# Patient Record
Sex: Male | Born: 1990 | Race: White | Hispanic: No | Marital: Single | Smoking: Current every day smoker
Health system: Southern US, Community
[De-identification: ages and names within clinical notes are randomized; demographics above are authoritative.]

---
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
## 2016-06-01 ENCOUNTER — Encounter (HOSPITAL_BASED_OUTPATIENT_CLINIC_OR_DEPARTMENT_OTHER): Payer: Self-pay | Admitting: Emergency Medicine

## 2016-06-01 ENCOUNTER — Emergency Department (HOSPITAL_BASED_OUTPATIENT_CLINIC_OR_DEPARTMENT_OTHER): Payer: 59

## 2016-06-01 ENCOUNTER — Encounter (HOSPITAL_COMMUNITY): Admission: EM | Disposition: A | Discharge status: Home / Self Care | Payer: Self-pay | Source: Home / Self Care

## 2016-06-01 ENCOUNTER — Inpatient Hospital Stay (HOSPITAL_BASED_OUTPATIENT_CLINIC_OR_DEPARTMENT_OTHER)
Admission: EM | Admit: 2016-06-01 | Discharge: 2016-06-03 | DRG: 340 | Disposition: A | Discharge status: Home / Self Care | Payer: 59 | Attending: Surgery | Admitting: Surgery

## 2016-06-01 ENCOUNTER — Emergency Department (HOSPITAL_COMMUNITY): Payer: 59 | Admitting: Certified Registered Nurse Anesthetist

## 2016-06-01 DIAGNOSIS — R1031 Right lower quadrant pain: Secondary | ICD-10-CM | POA: Diagnosis not present

## 2016-06-01 DIAGNOSIS — K353 Acute appendicitis with localized peritonitis: Principal | ICD-10-CM | POA: Diagnosis present

## 2016-06-01 DIAGNOSIS — K5900 Constipation, unspecified: Secondary | ICD-10-CM | POA: Diagnosis present

## 2016-06-01 DIAGNOSIS — K3589 Other acute appendicitis without perforation or gangrene: Secondary | ICD-10-CM

## 2016-06-01 DIAGNOSIS — K3580 Unspecified acute appendicitis: Secondary | ICD-10-CM | POA: Diagnosis present

## 2016-06-01 DIAGNOSIS — F17210 Nicotine dependence, cigarettes, uncomplicated: Secondary | ICD-10-CM | POA: Diagnosis present

## 2016-06-01 HISTORY — PX: LAPAROSCOPIC APPENDECTOMY: SHX408

## 2016-06-01 LAB — URINALYSIS, ROUTINE W REFLEX MICROSCOPIC
GLUCOSE, UA: NEGATIVE mg/dL
HGB URINE DIPSTICK: NEGATIVE
Ketones, ur: 15 mg/dL — AB
Leukocytes, UA: NEGATIVE
Nitrite: NEGATIVE
PH: 6 (ref 5.0–8.0)
Protein, ur: NEGATIVE mg/dL
SPECIFIC GRAVITY, URINE: 1.026 (ref 1.005–1.030)

## 2016-06-01 LAB — COMPREHENSIVE METABOLIC PANEL
ALK PHOS: 66 U/L (ref 38–126)
ALT: 25 U/L (ref 17–63)
ANION GAP: 8 (ref 5–15)
AST: 24 U/L (ref 15–41)
Albumin: 4.2 g/dL (ref 3.5–5.0)
BILIRUBIN TOTAL: 1.5 mg/dL — AB (ref 0.3–1.2)
BUN: 12 mg/dL (ref 6–20)
CALCIUM: 9 mg/dL (ref 8.9–10.3)
CO2: 28 mmol/L (ref 22–32)
CREATININE: 0.94 mg/dL (ref 0.61–1.24)
Chloride: 99 mmol/L — ABNORMAL LOW (ref 101–111)
GFR calc non Af Amer: >60 mL/min (ref >60)
Glucose, Bld: 92 mg/dL (ref 65–99)
Potassium: 3.5 mmol/L (ref 3.5–5.1)
Sodium: 135 mmol/L (ref 135–145)
TOTAL PROTEIN: 7.5 g/dL (ref 6.5–8.1)

## 2016-06-01 LAB — CBC
HCT: 42.3 % (ref 39.0–52.0)
HEMOGLOBIN: 14.8 g/dL (ref 13.0–17.0)
MCH: 30.7 pg (ref 26.0–34.0)
MCHC: 35 g/dL (ref 30.0–36.0)
MCV: 87.8 fL (ref 78.0–100.0)
Platelets: 176 10*3/uL (ref 150–400)
RBC: 4.82 MIL/uL (ref 4.22–5.81)
RDW: 12.5 % (ref 11.5–15.5)
WBC: 14.1 10*3/uL — AB (ref 4.0–10.5)

## 2016-06-01 LAB — CREATININE, SERUM: CREATININE: 1.03 mg/dL (ref 0.61–1.24)

## 2016-06-01 LAB — CBC WITH DIFFERENTIAL/PLATELET
BASOS PCT: 0 %
Basophils Absolute: 0 10*3/uL (ref 0.0–0.1)
EOS ABS: 0 10*3/uL (ref 0.0–0.7)
Eosinophils Relative: 0 %
HEMATOCRIT: 43 % (ref 39.0–52.0)
HEMOGLOBIN: 15.3 g/dL (ref 13.0–17.0)
LYMPHS ABS: 1.8 10*3/uL (ref 0.7–4.0)
Lymphocytes Relative: 11 %
MCH: 31.2 pg (ref 26.0–34.0)
MCHC: 35.6 g/dL (ref 30.0–36.0)
MCV: 87.6 fL (ref 78.0–100.0)
MONO ABS: 1.8 10*3/uL — AB (ref 0.1–1.0)
MONOS PCT: 11 %
NEUTROS ABS: 13.2 10*3/uL — AB (ref 1.7–7.7)
NEUTROS PCT: 78 %
Platelets: 180 10*3/uL (ref 150–400)
RBC: 4.91 MIL/uL (ref 4.22–5.81)
RDW: 12.6 % (ref 11.5–15.5)
WBC: 16.9 10*3/uL — ABNORMAL HIGH (ref 4.0–10.5)

## 2016-06-01 LAB — LIPASE, BLOOD: Lipase: 24 U/L (ref 11–51)

## 2016-06-01 SURGERY — APPENDECTOMY, LAPAROSCOPIC
Anesthesia: General | Site: Abdomen

## 2016-06-01 MED ORDER — MORPHINE SULFATE (PF) 4 MG/ML IV SOLN
4.0000 mg | Freq: Once | INTRAVENOUS | Status: AC
  Administered 2016-06-01: 4 mg via INTRAVENOUS
  Filled 2016-06-01: qty 1

## 2016-06-01 MED ORDER — PROMETHAZINE HCL 25 MG/ML IJ SOLN: 6.2500 mg | INTRAMUSCULAR | Status: DC | PRN

## 2016-06-01 MED ORDER — MORPHINE SULFATE (PF) 2 MG/ML IV SOLN
2.0000 mg | INTRAVENOUS | Status: DC | PRN
  Administered 2016-06-01 (×2): 2 mg via INTRAVENOUS
  Filled 2016-06-01 (×2): qty 1

## 2016-06-01 MED ORDER — IOPAMIDOL (ISOVUE-300) INJECTION 61%
100.0000 mL | Freq: Once | INTRAVENOUS | Status: AC | PRN
  Administered 2016-06-01: 100 mL via INTRAVENOUS

## 2016-06-01 MED ORDER — SODIUM CHLORIDE 0.9 % IV SOLN
INTRAVENOUS | Status: DC
  Administered 2016-06-01: 125 mL/h via INTRAVENOUS

## 2016-06-01 MED ORDER — BUPIVACAINE-EPINEPHRINE (PF) 0.25% -1:200000 IJ SOLN
INTRAMUSCULAR | Status: AC
  Filled 2016-06-01: qty 30

## 2016-06-01 MED ORDER — DIPHENHYDRAMINE HCL 25 MG PO CAPS: 25.0000 mg | ORAL_CAPSULE | Freq: Four times a day (QID) | ORAL | Status: DC | PRN

## 2016-06-01 MED ORDER — DEXAMETHASONE SODIUM PHOSPHATE 10 MG/ML IJ SOLN
INTRAMUSCULAR | Status: DC | PRN
  Administered 2016-06-01: 10 mg via INTRAVENOUS

## 2016-06-01 MED ORDER — PIPERACILLIN-TAZOBACTAM 3.375 G IVPB 30 MIN
3.3750 g | Freq: Once | INTRAVENOUS | Status: AC
  Administered 2016-06-01: 3.375 g via INTRAVENOUS
  Filled 2016-06-01 (×2): qty 50

## 2016-06-01 MED ORDER — DIPHENHYDRAMINE HCL 50 MG/ML IJ SOLN: 25.0000 mg | Freq: Four times a day (QID) | INTRAMUSCULAR | Status: DC | PRN

## 2016-06-01 MED ORDER — MIDAZOLAM HCL 5 MG/5ML IJ SOLN
INTRAMUSCULAR | Status: DC | PRN
  Administered 2016-06-01: 2 mg via INTRAVENOUS

## 2016-06-01 MED ORDER — ONDANSETRON 4 MG PO TBDP: 4.0000 mg | ORAL_TABLET | Freq: Four times a day (QID) | ORAL | Status: DC | PRN

## 2016-06-01 MED ORDER — 0.9 % SODIUM CHLORIDE (POUR BTL) OPTIME
TOPICAL | Status: DC | PRN
  Administered 2016-06-01: 1000 mL

## 2016-06-01 MED ORDER — ROCURONIUM BROMIDE 100 MG/10ML IV SOLN
INTRAVENOUS | Status: AC
  Filled 2016-06-01: qty 1

## 2016-06-01 MED ORDER — ONDANSETRON HCL 4 MG/2ML IJ SOLN: 4.0000 mg | Freq: Four times a day (QID) | INTRAMUSCULAR | Status: DC | PRN

## 2016-06-01 MED ORDER — PIPERACILLIN-TAZOBACTAM 3.375 G IVPB
3.3750 g | Freq: Three times a day (TID) | INTRAVENOUS | Status: DC
  Administered 2016-06-01 – 2016-06-03 (×5): 3.375 g via INTRAVENOUS
  Filled 2016-06-01 (×6): qty 50

## 2016-06-01 MED ORDER — LIDOCAINE HCL (CARDIAC) 20 MG/ML IV SOLN
INTRAVENOUS | Status: AC
  Filled 2016-06-01: qty 5

## 2016-06-01 MED ORDER — MIDAZOLAM HCL 2 MG/2ML IJ SOLN
INTRAMUSCULAR | Status: AC
  Filled 2016-06-01: qty 2

## 2016-06-01 MED ORDER — SUGAMMADEX SODIUM 200 MG/2ML IV SOLN
INTRAVENOUS | Status: AC
  Filled 2016-06-01: qty 2

## 2016-06-01 MED ORDER — SODIUM CHLORIDE 0.9 % IV SOLN
INTRAVENOUS | Status: DC
  Administered 2016-06-01 – 2016-06-03 (×3): via INTRAVENOUS

## 2016-06-01 MED ORDER — FENTANYL CITRATE (PF) 100 MCG/2ML IJ SOLN
INTRAMUSCULAR | Status: DC | PRN
  Administered 2016-06-01: 50 ug via INTRAVENOUS

## 2016-06-01 MED ORDER — HYDROMORPHONE HCL 1 MG/ML IJ SOLN
0.2500 mg | INTRAMUSCULAR | Status: DC | PRN
  Administered 2016-06-01: 0.5 mg via INTRAVENOUS

## 2016-06-01 MED ORDER — LACTATED RINGERS IV SOLN: INTRAVENOUS | Status: DC

## 2016-06-01 MED ORDER — FENTANYL CITRATE (PF) 250 MCG/5ML IJ SOLN
INTRAMUSCULAR | Status: AC
  Filled 2016-06-01: qty 5

## 2016-06-01 MED ORDER — SUCCINYLCHOLINE CHLORIDE 20 MG/ML IJ SOLN
INTRAMUSCULAR | Status: DC | PRN
  Administered 2016-06-01: 100 mg via INTRAVENOUS

## 2016-06-01 MED ORDER — LACTATED RINGERS IV SOLN
INTRAVENOUS | Status: DC | PRN
  Administered 2016-06-01: 20:00:00 via INTRAVENOUS

## 2016-06-01 MED ORDER — LACTATED RINGERS IR SOLN
Status: DC | PRN
  Administered 2016-06-01: 1000 mL

## 2016-06-01 MED ORDER — MEPERIDINE HCL 50 MG/ML IJ SOLN: 6.2500 mg | INTRAMUSCULAR | Status: DC | PRN

## 2016-06-01 MED ORDER — PROPOFOL 10 MG/ML IV BOLUS
INTRAVENOUS | Status: DC | PRN
  Administered 2016-06-01: 130 mg via INTRAVENOUS

## 2016-06-01 MED ORDER — ONDANSETRON HCL 4 MG/2ML IJ SOLN
4.0000 mg | Freq: Once | INTRAMUSCULAR | Status: AC
  Administered 2016-06-01: 4 mg via INTRAVENOUS
  Filled 2016-06-01: qty 2

## 2016-06-01 MED ORDER — LIDOCAINE HCL (CARDIAC) 20 MG/ML IV SOLN
INTRAVENOUS | Status: DC | PRN
  Administered 2016-06-01: 80 mg via INTRAVENOUS

## 2016-06-01 MED ORDER — PROPOFOL 10 MG/ML IV BOLUS
INTRAVENOUS | Status: AC
  Filled 2016-06-01: qty 20

## 2016-06-01 MED ORDER — SUGAMMADEX SODIUM 200 MG/2ML IV SOLN
INTRAVENOUS | Status: DC | PRN
  Administered 2016-06-01: 130 mg via INTRAVENOUS

## 2016-06-01 MED ORDER — BUPIVACAINE-EPINEPHRINE (PF) 0.25% -1:200000 IJ SOLN
INTRAMUSCULAR | Status: DC | PRN
  Administered 2016-06-01: 4 mL

## 2016-06-01 MED ORDER — METHOCARBAMOL 500 MG PO TABS: 500.0000 mg | ORAL_TABLET | Freq: Four times a day (QID) | ORAL | Status: DC | PRN

## 2016-06-01 MED ORDER — KETOROLAC TROMETHAMINE 30 MG/ML IJ SOLN
30.0000 mg | Freq: Four times a day (QID) | INTRAMUSCULAR | Status: DC
  Administered 2016-06-01 – 2016-06-03 (×6): 30 mg via INTRAVENOUS
  Filled 2016-06-01 (×6): qty 1

## 2016-06-01 MED ORDER — ROCURONIUM BROMIDE 100 MG/10ML IV SOLN
INTRAVENOUS | Status: DC | PRN
  Administered 2016-06-01: 30 mg via INTRAVENOUS

## 2016-06-01 MED ORDER — ONDANSETRON HCL 4 MG/2ML IJ SOLN
INTRAMUSCULAR | Status: DC | PRN
  Administered 2016-06-01: 4 mg via INTRAVENOUS

## 2016-06-01 MED ORDER — ONDANSETRON HCL 4 MG/2ML IJ SOLN
INTRAMUSCULAR | Status: AC
  Filled 2016-06-01: qty 2

## 2016-06-01 MED ORDER — HYDROMORPHONE HCL 1 MG/ML IJ SOLN
INTRAMUSCULAR | Status: AC
  Administered 2016-06-01: 22:00:00
  Filled 2016-06-01: qty 1

## 2016-06-01 MED ORDER — ENOXAPARIN SODIUM 40 MG/0.4ML ~~LOC~~ SOLN
40.0000 mg | SUBCUTANEOUS | Status: DC
  Administered 2016-06-02 – 2016-06-03 (×2): 40 mg via SUBCUTANEOUS
  Filled 2016-06-01 (×2): qty 0.4

## 2016-06-01 MED ORDER — OXYCODONE HCL 5 MG PO TABS
5.0000 mg | ORAL_TABLET | ORAL | Status: DC | PRN
  Administered 2016-06-02 – 2016-06-03 (×3): 10 mg via ORAL
  Filled 2016-06-01 (×4): qty 2

## 2016-06-01 MED ORDER — KETOROLAC TROMETHAMINE 30 MG/ML IJ SOLN: 30.0000 mg | Freq: Four times a day (QID) | INTRAMUSCULAR | Status: DC | PRN

## 2016-06-01 SURGICAL SUPPLY — 55 items
APL SKNCLS STERI-STRIP NONHPOA (GAUZE/BANDAGES/DRESSINGS) ×1
APPLIER CLIP 5 13 M/L LIGAMAX5 (MISCELLANEOUS)
APPLIER CLIP ROT 10 11.4 M/L (STAPLE)
APR CLP MED LRG 11.4X10 (STAPLE)
APR CLP MED LRG 5 ANG JAW (MISCELLANEOUS)
BAG SPEC RTRVL LRG 6X4 10 (ENDOMECHANICALS) ×1
BENZOIN TINCTURE PRP APPL 2/3 (GAUZE/BANDAGES/DRESSINGS) ×3 IMPLANT
CABLE HIGH FREQUENCY MONO STRZ (ELECTRODE) IMPLANT
CLIP APPLIE 5 13 M/L LIGAMAX5 (MISCELLANEOUS) IMPLANT
CLIP APPLIE ROT 10 11.4 M/L (STAPLE) IMPLANT
CLOSURE STERI-STRIP 1/4X4 (GAUZE/BANDAGES/DRESSINGS) ×2 IMPLANT
CLOSURE WOUND 1/2 X4 (GAUZE/BANDAGES/DRESSINGS) ×1
COVER SURGICAL LIGHT HANDLE (MISCELLANEOUS) ×3 IMPLANT
CUTTER FLEX LINEAR 45M (STAPLE) IMPLANT
DECANTER SPIKE VIAL GLASS SM (MISCELLANEOUS) ×3 IMPLANT
DRAPE LAPAROSCOPIC ABDOMINAL (DRAPES) ×3 IMPLANT
DRAPE UTILITY XL STRL (DRAPES) ×3 IMPLANT
DRSG TEGADERM 2-3/8X2-3/4 SM (GAUZE/BANDAGES/DRESSINGS) ×4 IMPLANT
DRSG TEGADERM 4X4.75 (GAUZE/BANDAGES/DRESSINGS) ×2 IMPLANT
ELECT PENCIL ROCKER SW 15FT (MISCELLANEOUS) IMPLANT
ELECT REM PT RETURN 9FT ADLT (ELECTROSURGICAL) ×3
ELECTRODE REM PT RTRN 9FT ADLT (ELECTROSURGICAL) ×1 IMPLANT
FILTER SMOKE EVAC LAPAROSHD (FILTER) ×3 IMPLANT
GAUZE SPONGE 2X2 8PLY STRL LF (GAUZE/BANDAGES/DRESSINGS) IMPLANT
GLOVE BIO SURGEON STRL SZ7 (GLOVE) ×3 IMPLANT
GLOVE BIOGEL PI IND STRL 7.0 (GLOVE) ×1 IMPLANT
GLOVE BIOGEL PI IND STRL 7.5 (GLOVE) ×1 IMPLANT
GLOVE BIOGEL PI INDICATOR 7.0 (GLOVE) ×2
GLOVE BIOGEL PI INDICATOR 7.5 (GLOVE) ×2
GOWN STRL REUS W/TWL LRG LVL3 (GOWN DISPOSABLE) ×6 IMPLANT
GOWN STRL REUS W/TWL XL LVL3 (GOWN DISPOSABLE) ×3 IMPLANT
IRRIG SUCT STRYKERFLOW 2 WTIP (MISCELLANEOUS) ×3
IRRIGATION SUCT STRKRFLW 2 WTP (MISCELLANEOUS) ×1 IMPLANT
KIT BASIN OR (CUSTOM PROCEDURE TRAY) ×3 IMPLANT
NS IRRIG 1000ML POUR BTL (IV SOLUTION) ×3 IMPLANT
POUCH SPECIMEN RETRIEVAL 10MM (ENDOMECHANICALS) ×3 IMPLANT
RELOAD 45 VASCULAR/THIN (ENDOMECHANICALS) IMPLANT
RELOAD STAPLE 45 2.5 WHT GRN (ENDOMECHANICALS) IMPLANT
RELOAD STAPLE 45 3.5 BLU ETS (ENDOMECHANICALS) IMPLANT
RELOAD STAPLE TA45 3.5 REG BLU (ENDOMECHANICALS) IMPLANT
SCISSORS LAP 5X35 DISP (ENDOMECHANICALS) IMPLANT
SHEARS HARMONIC ACE PLUS 36CM (ENDOMECHANICALS) IMPLANT
SOLUTION ANTI FOG 6CC (MISCELLANEOUS) ×3 IMPLANT
SPONGE GAUZE 2X2 STER 10/PKG (GAUZE/BANDAGES/DRESSINGS) ×2
STRIP CLOSURE SKIN 1/2X4 (GAUZE/BANDAGES/DRESSINGS) ×2 IMPLANT
SUT MNCRL AB 4-0 PS2 18 (SUTURE) ×3 IMPLANT
SUT VICRYL 0 ENDOLOOP (SUTURE) IMPLANT
TOWEL OR 17X26 10 PK STRL BLUE (TOWEL DISPOSABLE) ×3 IMPLANT
TRAY FOLEY W/METER SILVER 14FR (SET/KITS/TRAYS/PACK) ×3 IMPLANT
TRAY FOLEY W/METER SILVER 16FR (SET/KITS/TRAYS/PACK) ×3 IMPLANT
TRAY LAPAROSCOPIC (CUSTOM PROCEDURE TRAY) ×3 IMPLANT
TROCAR BLADELESS OPT 5 75 (ENDOMECHANICALS) ×6 IMPLANT
TROCAR XCEL 12X100 BLDLESS (ENDOMECHANICALS) IMPLANT
TROCAR XCEL BLUNT TIP 100MML (ENDOMECHANICALS) ×3 IMPLANT
TUBING INSUF HEATED (TUBING) ×3 IMPLANT

## 2016-06-01 NOTE — Anesthesia Procedure Notes (Signed)
Procedure Name: Intubation Date/Time: 06/01/2016 8:13 PM Performed by: Jarvis NewcomerARMISTEAD, Mouhamed Glassco A Pre-anesthesia Checklist: Patient identified, Timeout performed, Suction available, Patient being monitored and Emergency Drugs available Patient Re-evaluated:Patient Re-evaluated prior to inductionOxygen Delivery Method: Circle system utilized Preoxygenation: Pre-oxygenation with 100% oxygen Intubation Type: Cricoid Pressure applied, Rapid sequence and IV induction Laryngoscope Size: Mac and 4 Grade View: Grade I Tube type: Oral Tube size: 7.5 mm Number of attempts: 1 Airway Equipment and Method: Stylet Placement Confirmation: ETT inserted through vocal cords under direct vision,  positive ETCO2 and breath sounds checked- equal and bilateral Secured at: 22 cm Tube secured with: Tape Dental Injury: Teeth and Oropharynx as per pre-operative assessment  Comments: RSI with cricoid pressure by Dr. Hart RochesterHollis. ATOI.

## 2016-06-01 NOTE — Transfer of Care (Signed)
Immediate Anesthesia Transfer of Care Note  Patient: Brandon Berry  Procedure(s) Performed: Procedure(s): APPENDECTOMY LAPAROSCOPIC (N/A)  Patient Location: PACU  Anesthesia Type:General  Level of Consciousness: awake, alert , oriented and patient cooperative  Airway & Oxygen Therapy: Patient Spontanous Breathing and Patient connected to face mask oxygen  Post-op Assessment: Report given to RN, Post -op Vital signs reviewed and stable and Patient moving all extremities  Post vital signs: Reviewed and stable  Last Vitals:  Vitals:   06/01/16 1354 06/01/16 1832  BP: 112/68 123/70  Pulse: 84 82  Resp: 18   Temp: 37.2 C 36.9 C    Last Pain:  Vitals:   06/01/16 1846  TempSrc:   PainSc: 5          Complications: No apparent anesthesia complications

## 2016-06-01 NOTE — ED Provider Notes (Signed)
[facility] Note   CSN: [id number] Arrival date & time: 06/01/16  1342  First Provider Contact:  First MD Initiated Contact with Patient 06/01/16 1508     By signing my name below, I, Soijett Blue, attest that this documentation has been prepared under the direction and in the presence of Doug Sou, MD. Electronically Signed: Soijett Blue, ED Scribe. 06/01/16. 3:18 PM.    History   Chief Complaint Chief Complaint  Patient presents with  . Abdominal Pain    HPI Brandon Berry is a 25 y.o. male who presents to the Emergency Department complaining of 7-8/10, RLQ abdominal pain onset 1 week. Pt reports that went to an urgent care 2 days ago and he had an xray completed with no acute findings. Pt was informed by his sister who is a nurse to come into the ED for evaluation. Pt notes that his abdominal pain is worsened with ambulation, position change, and movement. Pt abdominal pain is alleviated with Remaining still. He states that he is having associated symptoms of orange-tinged urine, constipation x 3-4 days, chills, and dysuria. He states that he has treated himself with  800 mg ibuprofen with his last dose being yesterday and cranberry juice with no relief for his symptoms. He denies vomiting, fever, penile discharge, and any other symptoms. Last bowel movements 2 days ago other associated symptoms include diminished appetite Pt states that he smokes cigarettes and denies ETOH use. Denies having a PCP. Denies allergies to any medications. Pt states that he drove himself to the ED.     The history is provided by the patient. No language interpreter was used.  Abdominal Pain  Associated symptoms include dysuria. Pertinent negatives include fever and vomiting.    History reviewed. No pertinent past medical history.  There are no active problems to display for this patient.   History reviewed. No pertinent surgical history.     Home Medications    Prior to  Admission medications   Not on File    Family History History reviewed. No pertinent family history.  Social History Social History  Substance Use Topics  . Smoking status: Current Every Day Smoker  . Smokeless tobacco: Never Used  . Alcohol use No     Allergies   Review of patient's allergies indicates no known allergies.   Review of Systems Review of Systems  Constitutional: Positive for chills. Negative for fever.  HENT: Negative.   Respiratory: Negative.   Cardiovascular: Negative.   Gastrointestinal: Positive for abdominal pain. Negative for vomiting.  Genitourinary: Positive for dysuria. Negative for discharge.  Musculoskeletal: Negative.   Skin: Negative.   Neurological: Negative.   Psychiatric/Behavioral: Negative.   All other systems reviewed and are negative.    Physical Exam Updated Vital Signs BP 112/68 (BP Location: Left Arm)   Pulse 84   Temp 98.9 F (37.2 C) (Oral)   Resp 18   Ht 5\' 6"  (1.676 m)   Wt 140 lb (63.5 kg)   SpO2 99%   BMI 22.60 kg/m   Physical Exam  Constitutional: He is oriented to person, place, and time. He appears well-developed and well-nourished. No distress.  HENT:  Head: Normocephalic and atraumatic.  Eyes: Conjunctivae are normal.  Neck: Neck supple.  Cardiovascular: Normal rate and normal heart sounds.   Pulmonary/Chest: Effort normal.  Abdominal: Soft. He exhibits no distension. There is tenderness. There is no guarding.  Right lower quadrant tenderness  Genitourinary: Penis normal.  Genitourinary Comments: Scrotum normal  Musculoskeletal: Normal range of motion.  Neurological: He is alert and oriented to person, place, and time.  Skin: Skin is warm and dry.  Psychiatric: He has a normal mood and affect. His behavior is normal.  Nursing note and vitals reviewed.    ED Treatments / Results  DIAGNOSTIC STUDIES: Oxygen Saturation is 99% on RA, nl by my interpretation.    COORDINATION OF CARE: 3:17 PM  Discussed treatment plan with pt at bedside which includes UA, CT abdomen pelvis, labs, and pt agreed to plan.   Labs (all labs ordered are listed, but only abnormal results are displayed) Labs Reviewed  URINALYSIS, ROUTINE W REFLEX MICROSCOPIC (NOT AT [facility]) - Abnormal; Notable for the following:       Result Value   Color, Urine AMBER (*)    Bilirubin Urine SMALL (*)    Ketones, ur 15 (*)    All other components within normal limits  COMPREHENSIVE METABOLIC PANEL - Abnormal; Notable for the following:    Chloride 99 (*)    Total Bilirubin 1.5 (*)    All other components within normal limits  CBC WITH DIFFERENTIAL/PLATELET - Abnormal; Notable for the following:    WBC 16.9 (*)    Neutro Abs 13.2 (*)    Monocytes Absolute 1.8 (*)    All other components within normal limits  LIPASE, BLOOD    EKG  EKG Interpretation None       Radiology Ct Abdomen Pelvis W Contrast  Result Date: 06/01/2016 CLINICAL DATA:  RIGHT lower quadrant pain and tenderness EXAM: CT ABDOMEN AND PELVIS WITH CONTRAST TECHNIQUE: Multidetector CT imaging of the abdomen and pelvis was performed using the standard protocol following bolus administration of intravenous contrast. Sagittal and coronal MPR images reconstructed from axial data set. CONTRAST:  ISOVUE-300 IOPAMIDOL (ISOVUE-300) INJECTION 61% IV. Dilute oral contrast. COMPARISON:  None FINDINGS: Lower chest:  Lung bases clear Hepatobiliary: Tiny cyst posterior RIGHT lobe liver image 17. Minimal fatty infiltration of liver adjacent to falciform fissure. Gallbladder and liver otherwise normal appearance. Pancreas: Normal appearance Spleen: Normal appearance Adrenals/Urinary Tract: Adrenal glands normal appearance. Kidneys, ureters and bladder normal appearance. Stomach/Bowel: Enlarged thickened appendix with significant periappendiceal inflammatory changes compatible with acute appendicitis. Adjacent thickening of the cecal tip and an adjacent small bowel  loop. Small amount of free pelvic fluid without definite extraluminal gas collection to suggest abscess. No free intraperitoneal air. Stomach and remaining bowel loops unremarkable. Vascular/Lymphatic: Vascular structures patent. Few reactive mesenteric nodes without definite adenopathy. Reproductive: N/A Other: No hernia. Musculoskeletal: Osseous structures unremarkable. IMPRESSION: Acute appendicitis with significant periappendiceal and RIGHT lower quadrant inflammatory changes including thickening of a small bowel loop and of the cecal tip though definite evidence of perforation or abscess is identified. Findings called to Dr. Ethelda Chick on 06/01/2016 at 1658 hours. Electronically Signed   By: Ulyses Southward M.D.   On: 06/01/2016 16:59    Procedures Procedures (including critical care time)  Medications Ordered in ED Medications - No data to display   Initial Impression / Assessment and Plan / ED Course  I have reviewed the triage vital signs and the nursing notes.  Pertinent labs & imaging results that were available during my care of the patient were reviewed by me and considered in my medical decision making (see chart for details).  Clinical Course   Results for orders placed or performed during the hospital encounter of 06/01/16  Urinalysis, Routine w reflex microscopic (not at [facility])  Result Value Ref Range  Color, Urine AMBER (A) YELLOW   APPearance CLEAR CLEAR   Specific Gravity, Urine 1.026 1.005 - 1.030   pH 6.0 5.0 - 8.0   Glucose, UA NEGATIVE NEGATIVE mg/dL   Hgb urine dipstick NEGATIVE NEGATIVE   Bilirubin Urine SMALL (A) NEGATIVE   Ketones, ur 15 (A) NEGATIVE mg/dL   Protein, ur NEGATIVE NEGATIVE mg/dL   Nitrite NEGATIVE NEGATIVE   Leukocytes, UA NEGATIVE NEGATIVE  Comprehensive metabolic panel  Result Value Ref Range   Sodium 135 135 - 145 mmol/L   Potassium 3.5 3.5 - 5.1 mmol/L   Chloride 99 (L) 101 - 111 mmol/L   CO2 28 22 - 32 mmol/L   Glucose, Bld 92 65 - 99  mg/dL   BUN 12 6 - 20 mg/dL   Creatinine, Ser 4.09 0.61 - 1.24 mg/dL   Calcium 9.0 8.9 - 81.1 mg/dL   Total Protein 7.5 6.5 - 8.1 g/dL   Albumin 4.2 3.5 - 5.0 g/dL   AST 24 15 - 41 U/L   ALT 25 17 - 63 U/L   Alkaline Phosphatase 66 38 - 126 U/L   Total Bilirubin 1.5 (H) 0.3 - 1.2 mg/dL   GFR calc non Af Amer >60 >60 mL/min   GFR calc Af Amer >60 >60 mL/min   Anion gap 8 5 - 15  CBC with Differential/Platelet  Result Value Ref Range   WBC 16.9 (H) 4.0 - 10.5 K/uL   RBC 4.91 4.22 - 5.81 MIL/uL   Hemoglobin 15.3 13.0 - 17.0 g/dL   HCT 91.4 78.2 - 95.6 %   MCV 87.6 78.0 - 100.0 fL   MCH 31.2 26.0 - 34.0 pg   MCHC 35.6 30.0 - 36.0 g/dL   RDW 21.3 08.6 - 57.8 %   Platelets 180 150 - 400 K/uL   Neutrophils Relative % 78 %   Neutro Abs 13.2 (H) 1.7 - 7.7 K/uL   Lymphocytes Relative 11 %   Lymphs Abs 1.8 0.7 - 4.0 K/uL   Monocytes Relative 11 %   Monocytes Absolute 1.8 (H) 0.1 - 1.0 K/uL   Eosinophils Relative 0 %   Eosinophils Absolute 0.0 0.0 - 0.7 K/uL   Basophils Relative 0 %   Basophils Absolute 0.0 0.0 - 0.1 K/uL  Lipase, blood  Result Value Ref Range   Lipase 24 11 - 51 U/L   Ct Abdomen Pelvis W Contrast  Result Date: 06/01/2016 CLINICAL DATA:  RIGHT lower quadrant pain and tenderness EXAM: CT ABDOMEN AND PELVIS WITH CONTRAST TECHNIQUE: Multidetector CT imaging of the abdomen and pelvis was performed using the standard protocol following bolus administration of intravenous contrast. Sagittal and coronal MPR images reconstructed from axial data set. CONTRAST:  ISOVUE-300 IOPAMIDOL (ISOVUE-300) INJECTION 61% IV. Dilute oral contrast. COMPARISON:  None FINDINGS: Lower chest:  Lung bases clear Hepatobiliary: Tiny cyst posterior RIGHT lobe liver image 17. Minimal fatty infiltration of liver adjacent to falciform fissure. Gallbladder and liver otherwise normal appearance. Pancreas: Normal appearance Spleen: Normal appearance Adrenals/Urinary Tract: Adrenal glands normal  appearance. Kidneys, ureters and bladder normal appearance. Stomach/Bowel: Enlarged thickened appendix with significant periappendiceal inflammatory changes compatible with acute appendicitis. Adjacent thickening of the cecal tip and an adjacent small bowel loop. Small amount of free pelvic fluid without definite extraluminal gas collection to suggest abscess. No free intraperitoneal air. Stomach and remaining bowel loops unremarkable. Vascular/Lymphatic: Vascular structures patent. Few reactive mesenteric nodes without definite adenopathy. Reproductive: N/A Other: No hernia. Musculoskeletal: Osseous structures unremarkable. IMPRESSION: Acute appendicitis  with significant periappendiceal and RIGHT lower quadrant inflammatory changes including thickening of a small bowel loop and of the cecal tip though definite evidence of perforation or abscess is identified. Findings called to Dr. Ethelda ChickJacubowitz on 06/01/2016 at 1658 hours. Electronically Signed   By: Ulyses SouthwardMark  Boles M.D.   On: 06/01/2016 16:59   5:15pm. Resting comfortably declines pain medicine. Dr.Tsuei from general surgery service consulted. [facility] . Will sdee pt in rthe ED Iv zosyn ordered  [facility] and is aware of transfer Final Clinical Impressions(s) / ED Diagnoses  Dx acute appendicitis Final diagnoses:  None    New Prescriptions New Prescriptions   No medications on file    I personally performed the services described in this documentation, which was scribed in my presence. The recorded information has been reviewed and considered.     Doug SouSam Neel Buffone, MD 06/01/16 1739

## 2016-06-01 NOTE — H&P (Signed)
Brandon Berry is an 25 y.o. male.   Chief Complaint: RLQ abdominal pain HPI: 25 yo male presents with a 7 day history of lower abdominal pain that has localized to his RLQ.  His pain has worsened with ambulation and movement. He has been constipated for the last 3 days and has some dark urine and dysuria.  No nausea or vomiting.  He went to [facility] for evaluation and was found to have acute appendicitis.  History reviewed. No pertinent past medical history.  History reviewed. No pertinent surgical history.  History reviewed. No pertinent family history. Social History:  reports that he has been smoking.  He has never used smokeless tobacco. He reports that he does not drink alcohol or use drugs.  Allergies: No Known Allergies  Prior to Admission medications   Medication Sig Start Date End Date Taking? Authorizing Provider  dicyclomine (BENTYL) 10 MG capsule Take 10 mg by mouth 3 (three) times daily before meals.  05/31/16  Yes Historical Provider, MD  ibuprofen (ADVIL,MOTRIN) 800 MG tablet Take 800 mg by mouth every 8 (eight) hours as needed for moderate pain.   Yes Historical Provider, MD  phenazopyridine (PYRIDIUM) 200 MG tablet Take 200 mg by mouth 3 (three) times daily as needed for pain.  05/31/16  Yes Historical Provider, MD     Results for orders placed or performed during the hospital encounter of 06/01/16 (from the past 48 hour(s))  Urinalysis, Routine w reflex microscopic (not at [facility])     Status: Abnormal   Collection Time: 06/01/16  2:15 PM  Result Value Ref Range   Color, Urine AMBER (A) YELLOW    Comment: BIOCHEMICALS MAY BE AFFECTED BY COLOR   APPearance CLEAR CLEAR   Specific Gravity, Urine 1.026 1.005 - 1.030   pH 6.0 5.0 - 8.0   Glucose, UA NEGATIVE NEGATIVE mg/dL   Hgb urine dipstick NEGATIVE NEGATIVE   Bilirubin Urine SMALL (A) NEGATIVE   Ketones, ur 15 (A) NEGATIVE mg/dL   Protein, ur NEGATIVE NEGATIVE mg/dL   Nitrite NEGATIVE NEGATIVE   Leukocytes, UA NEGATIVE  NEGATIVE    Comment: MICROSCOPIC NOT DONE ON URINES WITH NEGATIVE PROTEIN, BLOOD, LEUKOCYTES, NITRITE, OR GLUCOSE <1000 mg/dL.  Comprehensive metabolic panel     Status: Abnormal   Collection Time: 06/01/16  4:00 PM  Result Value Ref Range   Sodium 135 135 - 145 mmol/L   Potassium 3.5 3.5 - 5.1 mmol/L   Chloride 99 (L) 101 - 111 mmol/L   CO2 28 22 - 32 mmol/L   Glucose, Bld 92 65 - 99 mg/dL   BUN 12 6 - 20 mg/dL   Creatinine, Ser 0.94 0.61 - 1.24 mg/dL   Calcium 9.0 8.9 - 10.3 mg/dL   Total Protein 7.5 6.5 - 8.1 g/dL   Albumin 4.2 3.5 - 5.0 g/dL   AST 24 15 - 41 U/L   ALT 25 17 - 63 U/L   Alkaline Phosphatase 66 38 - 126 U/L   Total Bilirubin 1.5 (H) 0.3 - 1.2 mg/dL   GFR calc non Af Amer >60 >60 mL/min   GFR calc Af Amer >60 >60 mL/min    Comment: (NOTE) The eGFR has been calculated using the CKD EPI equation. This calculation has not been validated in all clinical situations. eGFR's persistently <60 mL/min signify possible Chronic Kidney Disease.    Anion gap 8 5 - 15  CBC with Differential/Platelet     Status: Abnormal   Collection Time: 06/01/16  4:00  PM  Result Value Ref Range   WBC 16.9 (H) 4.0 - 10.5 K/uL   RBC 4.91 4.22 - 5.81 MIL/uL   Hemoglobin 15.3 13.0 - 17.0 g/dL   HCT 43.0 39.0 - 52.0 %   MCV 87.6 78.0 - 100.0 fL   MCH 31.2 26.0 - 34.0 pg   MCHC 35.6 30.0 - 36.0 g/dL   RDW 12.6 11.5 - 15.5 %   Platelets 180 150 - 400 K/uL   Neutrophils Relative % 78 %   Neutro Abs 13.2 (H) 1.7 - 7.7 K/uL   Lymphocytes Relative 11 %   Lymphs Abs 1.8 0.7 - 4.0 K/uL   Monocytes Relative 11 %   Monocytes Absolute 1.8 (H) 0.1 - 1.0 K/uL   Eosinophils Relative 0 %   Eosinophils Absolute 0.0 0.0 - 0.7 K/uL   Basophils Relative 0 %   Basophils Absolute 0.0 0.0 - 0.1 K/uL  Lipase, blood     Status: None   Collection Time: 06/01/16  4:00 PM  Result Value Ref Range   Lipase 24 11 - 51 U/L   Ct Abdomen Pelvis W Contrast  Result Date: 06/01/2016 CLINICAL DATA:  RIGHT lower  quadrant pain and tenderness EXAM: CT ABDOMEN AND PELVIS WITH CONTRAST TECHNIQUE: Multidetector CT imaging of the abdomen and pelvis was performed using the standard protocol following bolus administration of intravenous contrast. Sagittal and coronal MPR images reconstructed from axial data set. CONTRAST:  128m ISOVUE-300 IOPAMIDOL (ISOVUE-300) INJECTION 61% IV. Dilute oral contrast. COMPARISON:  None FINDINGS: Lower chest:  Lung bases clear Hepatobiliary: Tiny cyst posterior RIGHT lobe liver image 17. Minimal fatty infiltration of liver adjacent to falciform fissure. Gallbladder and liver otherwise normal appearance. Pancreas: Normal appearance Spleen: Normal appearance Adrenals/Urinary Tract: Adrenal glands normal appearance. Kidneys, ureters and bladder normal appearance. Stomach/Bowel: Enlarged thickened appendix with significant periappendiceal inflammatory changes compatible with acute appendicitis. Adjacent thickening of the cecal tip and an adjacent small bowel loop. Small amount of free pelvic fluid without definite extraluminal gas collection to suggest abscess. No free intraperitoneal air. Stomach and remaining bowel loops unremarkable. Vascular/Lymphatic: Vascular structures patent. Few reactive mesenteric nodes without definite adenopathy. Reproductive: N/A Other: No hernia. Musculoskeletal: Osseous structures unremarkable. IMPRESSION: Acute appendicitis with significant periappendiceal and RIGHT lower quadrant inflammatory changes including thickening of a small bowel loop and of the cecal tip though definite evidence of perforation or abscess is identified. Findings called to Dr. JWinfred Leedson 06/01/2016 at 1658 hours. Electronically Signed   By: MLavonia DanaM.D.   On: 06/01/2016 16:59    Review of Systems  Constitutional: Negative for weight loss.  HENT: Negative for ear discharge, ear pain, hearing loss and tinnitus.   Eyes: Negative for blurred vision, double vision, photophobia and pain.   Respiratory: Negative for cough, sputum production and shortness of breath.   Cardiovascular: Negative for chest pain.  Gastrointestinal: Positive for abdominal pain and constipation.  Genitourinary: Positive for dysuria. Negative for flank pain, frequency and urgency.  Musculoskeletal: Negative for back pain, falls, joint pain, myalgias and neck pain.  Neurological: Negative for dizziness, tingling, sensory change, focal weakness, loss of consciousness and headaches.  Endo/Heme/Allergies: Does not bruise/bleed easily.  Psychiatric/Behavioral: Negative for depression, memory loss and substance abuse. The patient is not nervous/anxious.     Blood pressure 123/70, pulse 82, temperature 98.4 F (36.9 C), temperature source Oral, resp. rate 18, height '5\' 6"'$  (1.676 m), weight 63.5 kg (140 lb), SpO2 95 %. Physical Exam  WDWN in NAD HEENT:  EOMI, sclera anicteric Neck:  No masses, no thyromegaly Lungs:  CTA bilaterally; normal respiratory effort CV:  Regular rate and rhythm; no murmurs Abd:  +bowel sounds, soft, tender in RLQ; some guarding Ext:  Well-perfused; no edema Skin:  Warm, dry; no sign of jaundice  Assessment/Plan Acute appendicitis -no obvious sign of perforation or abscess  To the OR tonight for laparoscopic appendectomy/ possible open appendectomy. The surgical procedure has been discussed with the patient.  Potential risks, benefits, alternative treatments, and expected outcomes have been explained.  All of the patient's questions at this time have been answered.  The likelihood of reaching the patient's treatment goal is good.  The patient understand the proposed surgical procedure and wishes to proceed.   Maia Petties., MD 06/01/2016, 7:22 PM

## 2016-06-01 NOTE — Anesthesia Preprocedure Evaluation (Addendum)
Anesthesia Evaluation  Patient identified by MRN, date of birth, ID band Patient awake    Reviewed: Allergy & Precautions, NPO status , Patient's Chart, lab work & pertinent test results  Airway Mallampati: I  TM Distance: >3 FB Neck ROM: Full    Dental  (+) Teeth Intact, Dental Advisory Given   Pulmonary neg pulmonary ROS, Current Smoker,    breath sounds clear to auscultation       Cardiovascular negative cardio ROS   Rhythm:Regular Rate:Normal     Neuro/Psych negative neurological ROS  negative psych ROS   GI/Hepatic negative GI ROS, Neg liver ROS,   Endo/Other  negative endocrine ROS  Renal/GU negative Renal ROS  negative genitourinary   Musculoskeletal negative musculoskeletal ROS (+)   Abdominal (+)  Abdomen: tender.    Peds negative pediatric ROS (+)  Hematology negative hematology ROS (+)   Anesthesia Other Findings   Reproductive/Obstetrics negative OB ROS                            Anesthesia Physical Anesthesia Plan  ASA: I and emergent  Anesthesia Plan: General   Post-op Pain Management:    Induction: Intravenous, Rapid sequence and Cricoid pressure planned  Airway Management Planned: Oral ETT  Additional Equipment:   Intra-op Plan:   Post-operative Plan: Extubation in OR  Informed Consent: I have reviewed the patients History and Physical, chart, labs and discussed the procedure including the risks, benefits and alternatives for the proposed anesthesia with the patient or authorized representative who has indicated his/her understanding and acceptance.   Dental advisory given  Plan Discussed with: CRNA  Anesthesia Plan Comments:         Anesthesia Quick Evaluation

## 2016-06-01 NOTE — Anesthesia Postprocedure Evaluation (Signed)
Anesthesia Post Note  Patient: Brandon Berry  Procedure(s) Performed: Procedure(s) (LRB): APPENDECTOMY LAPAROSCOPIC (N/A)  Patient location during evaluation: PACU Anesthesia Type: General Level of consciousness: awake and alert Pain management: pain level controlled Vital Signs Assessment: post-procedure vital signs reviewed and stable Respiratory status: spontaneous breathing, nonlabored ventilation, respiratory function stable and patient connected to nasal cannula oxygen Cardiovascular status: blood pressure returned to baseline and stable Postop Assessment: no signs of nausea or vomiting Anesthetic complications: no    Last Vitals:  Vitals:   06/01/16 2102 06/01/16 2145  BP: 131/72   Pulse: 84   Resp: 14   Temp: 36.8 C 37.4 C    Last Pain:  Vitals:   06/01/16 2130  TempSrc:   PainSc: Asleep                 Shelton SilvasKevin D Mykah Shin

## 2016-06-01 NOTE — ED Triage Notes (Signed)
Patient reports that he has had pain to his right kidneys, and lower back pain x 1 week ago. He reports that he went to an urgent care, and was told to come here for evaluation of a kidney stone. Reports dark blood in urine

## 2016-06-01 NOTE — Op Note (Signed)
Appendectomy, Lap, Procedure Note  Indications: The patient presented with a 7 day history of right-sided abdominal pain. A CT scan revealed findings consistent with acute appendicitis without obvious perforation.    Pre-operative Diagnosis: Acute appendicitis without mention of peritonitis  Post-operative Diagnosis: Acute appendicitis with peritoneal abscess  Surgeon: Wynona LunaSUEI,Hattye Siegfried K.   Assistants: none  Anesthesia: General endotracheal anesthesia  ASA Class: 1E  Procedure Details  The patient was seen again in the Holding Room. The risks, benefits, complications, treatment options, and expected outcomes were discussed with the patient and/or family. The possibilities of reaction to medication, perforation of viscus, bleeding, recurrent infection, finding a normal appendix, the need for additional procedures, failure to diagnose a condition, and creating a complication requiring transfusion or operation were discussed. There was concurrence with the proposed plan and informed consent was obtained. The site of surgery was properly noted. The patient was taken to Operating Room, identified as Brandon MarinerOwen C Fogal and the procedure verified as Appendectomy. A Time Out was held and the above information confirmed.  The patient was placed in the supine position and general anesthesia was induced.  The abdomen was prepped and draped in a sterile fashion. A one centimeter supraumbilical incision was made.  Dissection was carried down to the fascia bluntly.  The fascia was incised vertically.  We entered the peritoneal cavity bluntly.  A pursestring suture was passed around the incision with a 0 Vicryl.  The Hasson cannula was introduced into the abdomen and the tails of the suture were used to hold the Hasson in place.   The pneumoperitoneum was then established maintaining a maximum pressure of 15 mmHg.  Additional 5 mm cannulas then placed in the left lower quadrant of the abdomen and the right upper  quadrant under direct visualization. A careful evaluation of the entire abdomen was carried out. The patient was placed in Trendelenburg and left lateral decubitus position.  The scope was moved to the right upper quadrant port site. The omentum is adherent to the anterior abdominal wall.  We peeled this away and encountered some purulent exudate.  The cecum was mobilized medially.  The appendix was adherent down into the pelvis with some overlying small bowel and omentum.  We exposed the appendix with blunt dissection.  There is an obvious perforation in the proximal appendix, but the base of the appendix seems viable. The appendix was carefully dissected. The appendix was was skeletonized with the harmonic scalpel.   The appendix was divided at its base using an endo-GIA stapler.  No appendiceal stump was left in place. There was no evidence of bleeding, leakage, or complication after division of the appendix. Irrigation was also performed and irrigate suctioned from the abdomen as well.  The umbilical port site was closed with the purse string suture. There was no residual palpable fascial defect.  The trocar site skin wounds were closed with 4-0 Monocryl.  Instrument, sponge, and needle counts were correct at the conclusion of the case.   Findings: The appendix was found to be inflamed. There were signs of necrosis.  There was perforation. There was abscess formation.  Estimated Blood Loss:  less than 50 mL         Drains: none         Specimens: appendix         Complications:  None; patient tolerated the procedure well.         Disposition: PACU - hemodynamically stable.  Condition: stable  Wilmon Arms. Corliss Skains, MD, [facility] Surgery  General/ Trauma Surgery  06/01/2016 9:04 PM

## 2016-06-01 NOTE — ED Notes (Signed)
[facility] at bedside, report given, pt care transferred. Pt and his mother are aware of pending transport to [facility] for eval by general surgery.

## 2016-06-02 ENCOUNTER — Encounter (HOSPITAL_COMMUNITY): Payer: Self-pay | Admitting: Surgery

## 2016-06-02 DIAGNOSIS — R1031 Right lower quadrant pain: Secondary | ICD-10-CM | POA: Diagnosis present

## 2016-06-02 DIAGNOSIS — F17210 Nicotine dependence, cigarettes, uncomplicated: Secondary | ICD-10-CM | POA: Diagnosis present

## 2016-06-02 DIAGNOSIS — K5900 Constipation, unspecified: Secondary | ICD-10-CM | POA: Diagnosis present

## 2016-06-02 DIAGNOSIS — K353 Acute appendicitis with localized peritonitis: Secondary | ICD-10-CM | POA: Diagnosis present

## 2016-06-02 LAB — BASIC METABOLIC PANEL
ANION GAP: 8 (ref 5–15)
BUN: 14 mg/dL (ref 6–20)
CALCIUM: 9.2 mg/dL (ref 8.9–10.3)
CO2: 25 mmol/L (ref 22–32)
Chloride: 102 mmol/L (ref 101–111)
Creatinine, Ser: 1.01 mg/dL (ref 0.61–1.24)
Glucose, Bld: 161 mg/dL — ABNORMAL HIGH (ref 65–99)
Potassium: 4.8 mmol/L (ref 3.5–5.1)
Sodium: 135 mmol/L (ref 135–145)

## 2016-06-02 LAB — CBC
HCT: 41.3 % (ref 39.0–52.0)
HEMOGLOBIN: 14.9 g/dL (ref 13.0–17.0)
MCH: 31.6 pg (ref 26.0–34.0)
MCHC: 36.1 g/dL — ABNORMAL HIGH (ref 30.0–36.0)
MCV: 87.5 fL (ref 78.0–100.0)
Platelets: 168 10*3/uL (ref 150–400)
RBC: 4.72 MIL/uL (ref 4.22–5.81)
RDW: 12.5 % (ref 11.5–15.5)
WBC: 19.2 10*3/uL — ABNORMAL HIGH (ref 4.0–10.5)

## 2016-06-02 NOTE — Progress Notes (Addendum)
1 Day Post-Op  Subjective: He looks pretty good, not really complaining,  Pain for 7 days before he came to see someone.  No flatus.  Pretty sore.  Wants to know when he can go back to work.  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    Objective: Vital signs in last 24 hours: Temp:  [98.2 F (36.8 C)-99.7 F (37.6 C)] 98.2 F (36.8 C) (08/08 0946) Pulse Rate:  [62-84] 65 (08/08 0946) Resp:  [14-18] 18 (08/08 0946) BP: (106-131)/(55-72) 106/55 (08/08 0946) SpO2:  [95 %-100 %] 100 % (08/08 0946) Weight:  [63.5 kg (140 lb)] 63.5 kg (140 lb) (08/07 1832) Last BM Date: 05/29/16 360 PO Urine 400 Afebrile, VSS WBC up this AM  Intake/Output from previous day: 08/07 0701 - 08/08 0700 In: 2120 [P.O.:360; I.V.:1660; IV Piggyback:100] Out: 415 [Urine:400; Blood:15] Intake/Output this shift: Total I/O In: 420 [P.O.:120; I.V.:300] Out: 0   General appearance: alert, cooperative and no distress GI: soft, sore, no bowel sounds.  dressings OK  Lab Results:   Recent Labs  06/01/16 2228 06/02/16 0510  WBC 14.1* 19.2*  HGB 14.8 14.9  HCT 42.3 41.3  PLT 176 168    BMET  Recent Labs  06/01/16 1600 06/01/16 2228 06/02/16 0510  NA 135  --  135  K 3.5  --  4.8  CL 99*  --  102  CO2 28  --  25  GLUCOSE 92  --  161*  BUN 12  --  14  CREATININE 0.94 1.03 1.01  CALCIUM 9.0  --  9.2   PT/INR No results for input(s): LABPROT, INR in the last 72 hours.   Recent Labs Lab 06/01/16 1600  AST 24  ALT 25  ALKPHOS 66  BILITOT 1.5*  PROT 7.5  ALBUMIN 4.2     Lipase     Component Value Date/Time   LIPASE 24 06/01/2016 1600     Studies/Results: Ct Abdomen Pelvis W Contrast  Result Date: 06/01/2016 CLINICAL DATA:  RIGHT lower quadrant pain and tenderness EXAM: CT ABDOMEN AND PELVIS WITH CONTRAST TECHNIQUE: Multidetector CT imaging of the abdomen and pelvis was performed using the standard protocol following bolus administration of intravenous contrast.  Sagittal and coronal MPR images reconstructed from axial data set. CONTRAST:  100mL ISOVUE-300 IOPAMIDOL (ISOVUE-300) INJECTION 61% IV. Dilute oral contrast. COMPARISON:  None FINDINGS: Lower chest:  Lung bases clear Hepatobiliary: Tiny cyst posterior RIGHT lobe liver image 17. Minimal fatty infiltration of liver adjacent to falciform fissure. Gallbladder and liver otherwise normal appearance. Pancreas: Normal appearance Spleen: Normal appearance Adrenals/Urinary Tract: Adrenal glands normal appearance. Kidneys, ureters and bladder normal appearance. Stomach/Bowel: Enlarged thickened appendix with significant periappendiceal inflammatory changes compatible with acute appendicitis. Adjacent thickening of the cecal tip and an adjacent small bowel loop. Small amount of free pelvic fluid without definite extraluminal gas collection to suggest abscess. No free intraperitoneal air. Stomach and remaining bowel loops unremarkable. Vascular/Lymphatic: Vascular structures patent. Few reactive mesenteric nodes without definite adenopathy. Reproductive: N/A Other: No hernia. Musculoskeletal: Osseous structures unremarkable. IMPRESSION: Acute appendicitis with significant periappendiceal and RIGHT lower quadrant inflammatory changes including thickening of a small bowel loop and of the cecal tip though definite evidence of perforation or abscess is identified. Findings called to Dr. Ethelda ChickJacubowitz on 06/01/2016 at 1658 hours. Electronically Signed   By: Ulyses SouthwardMark  Boles M.D.   On: 06/01/2016 16:59   Prior to Admission medications   Medication Sig Start Date End Date Taking? Authorizing Provider  dicyclomine (BENTYL) 10 MG capsule Take 10 mg by mouth 3 (three) times daily before meals.  05/31/16  Yes Historical Provider, MD  ibuprofen (ADVIL,MOTRIN) 800 MG tablet Take 800 mg by mouth every 8 (eight) hours as needed for moderate pain.   Yes Historical Provider, MD  phenazopyridine (PYRIDIUM) 200 MG tablet Take 200 mg by mouth 3 (three)  times daily as needed for pain.  05/31/16  Yes Historical Provider, MD    Medications: . enoxaparin (LOVENOX) injection  40 mg Subcutaneous Q24H  . ketorolac  30 mg Intravenous Q6H  . piperacillin-tazobactam (ZOSYN)  IV  3.375 g Intravenous Q8H   . sodium chloride 75 mL/hr at 06/02/16 1157   Assessment/Plan Acute appendicitis with perforation and peritoneal abscess  S/p laparoscopic appendectomy, 06/01/16, Dr. Manus Rudd FEN: clears/IV fluids ID:   Zosyn day 1 DVT:  Lovenox/SCD  Plan:  Leave him on clears, abx, and get him up some.  Recheck labs in AM, Dr. Corliss Skains said to expect some ileus.       LOS: 0 days    JENNINGS,WILLARD 06/02/2016 [phone]  Agree with above. Looks comfortable.  Minimal abdominal complaints.  Ovidio Kin, MD, [facility] Surgery Pager: [phone] Office phone:  [phone]

## 2016-06-03 ENCOUNTER — Encounter: Payer: Self-pay | Admitting: General Surgery

## 2016-06-03 LAB — CBC
HCT: 37.3 % — ABNORMAL LOW (ref 39.0–52.0)
Hemoglobin: 12.9 g/dL — ABNORMAL LOW (ref 13.0–17.0)
MCH: 30.4 pg (ref 26.0–34.0)
MCHC: 34.6 g/dL (ref 30.0–36.0)
MCV: 87.8 fL (ref 78.0–100.0)
PLATELETS: 173 10*3/uL (ref 150–400)
RBC: 4.25 MIL/uL (ref 4.22–5.81)
RDW: 12.6 % (ref 11.5–15.5)
WBC: 14.2 10*3/uL — ABNORMAL HIGH (ref 4.0–10.5)

## 2016-06-03 LAB — BASIC METABOLIC PANEL
Anion gap: 5 (ref 5–15)
BUN: 16 mg/dL (ref 6–20)
CALCIUM: 8.9 mg/dL (ref 8.9–10.3)
CO2: 28 mmol/L (ref 22–32)
CREATININE: 1.11 mg/dL (ref 0.61–1.24)
Chloride: 105 mmol/L (ref 101–111)
GFR calc Af Amer: >60 mL/min (ref >60)
GLUCOSE: 110 mg/dL — AB (ref 65–99)
POTASSIUM: 4 mmol/L (ref 3.5–5.1)
Sodium: 138 mmol/L (ref 135–145)

## 2016-06-03 MED ORDER — IBUPROFEN 200 MG PO TABS: ORAL_TABLET | ORAL | Status: AC

## 2016-06-03 MED ORDER — SACCHAROMYCES BOULARDII 250 MG PO CAPS
250.0000 mg | ORAL_CAPSULE | Freq: Two times a day (BID) | ORAL | Status: DC
Start: 2016-06-03 — End: 2016-06-03
  Administered 2016-06-03: 250 mg via ORAL
  Filled 2016-06-03: qty 1

## 2016-06-03 MED ORDER — AMOXICILLIN-POT CLAVULANATE 875-125 MG PO TABS: 1.0000 | ORAL_TABLET | Freq: Two times a day (BID) | ORAL | 0 refills | Status: AC

## 2016-06-03 MED ORDER — AMOXICILLIN-POT CLAVULANATE 875-125 MG PO TABS: 1.0000 | ORAL_TABLET | Freq: Two times a day (BID) | ORAL | Status: DC

## 2016-06-03 MED ORDER — SACCHAROMYCES BOULARDII 250 MG PO CAPS: ORAL_CAPSULE | ORAL | Status: AC

## 2016-06-03 MED ORDER — ACETAMINOPHEN 325 MG PO TABS: ORAL_TABLET | ORAL | 2 refills | Status: AC

## 2016-06-03 MED ORDER — OXYCODONE HCL 5 MG PO TABS: 5.0000 mg | ORAL_TABLET | ORAL | 0 refills | Status: AC | PRN

## 2016-06-03 NOTE — Discharge Instructions (Signed)
Laparoscopic Appendectomy, Adult, Care After °Refer to this sheet in the next few weeks. These instructions provide you with information on caring for yourself after your procedure. Your caregiver may also give you more specific instructions. Your treatment has been planned according to current medical practices, but problems sometimes occur. Call your caregiver if you have any problems or questions after your procedure. °HOME CARE INSTRUCTIONS °· Do not drive while taking narcotic pain medicines. °· Use stool softener if you become constipated from your pain medicines. °· Change your bandages (dressings) as directed. °· Keep your wounds clean and dry. You may wash the wounds gently with soap and water. Gently pat the wounds dry with a clean towel. °· Do not take baths, swim, or use hot tubs for 10 days, or as instructed by your caregiver. °· Only take over-the-counter or prescription medicines for pain, discomfort, or fever as directed by your caregiver. °· You may continue your normal diet as directed. °· Do not lift more than 10 pounds (4.5 kg) or play contact sports for 3 weeks, or as directed. °· Slowly increase your activity after surgery. °· Take deep breaths to avoid getting a lung infection (pneumonia). °SEEK MEDICAL CARE IF: °· You have redness, swelling, or increasing pain in your wounds. °· You have pus coming from your wounds. °· You have drainage from a wound that lasts longer than 1 day. °· You notice a bad smell coming from the wounds or dressing. °· Your wound edges break open after stitches (sutures) have been removed. °· You notice increasing pain in the shoulders (shoulder strap areas) or near your shoulder blades. °· You develop dizzy episodes or fainting while standing. °· You develop shortness of breath. °· You develop persistent nausea or vomiting. °· You cannot control your bowel functions or lose your appetite. °· You develop diarrhea. °SEEK IMMEDIATE MEDICAL CARE IF:  °· You have a  fever. °· You develop a rash. °· You have difficulty breathing or sharp pains in your chest. °· You develop any reaction or side effects to medicines given. °MAKE SURE YOU: °· Understand these instructions. °· Will watch your condition. °· Will get help right away if you are not doing well or get worse. °  °This information is not intended to replace advice given to you by your health care provider. Make sure you discuss any questions you have with your health care provider. °  °Document Released: 10/12/2005 Document Revised: 02/26/2015 Document Reviewed: 04/01/2015 °Elsevier Interactive Patient Education ©2016 [employer] ° °CCS ______[facility] °LAPAROSCOPIC SURGERY: POST OP INSTRUCTIONS °Always review your discharge instruction sheet given to you by the facility where your surgery was performed. °IF YOU HAVE DISABILITY OR FAMILY LEAVE FORMS, YOU MUST BRING THEM TO THE OFFICE FOR PROCESSING.   °DO NOT GIVE THEM TO YOUR DOCTOR. ° °1. A prescription for pain medication may be given to you upon discharge.  Take your pain medication as prescribed, if needed.  If narcotic pain medicine is not needed, then you may take acetaminophen (Tylenol) or ibuprofen (Advil) as needed. °2. Take your usually prescribed medications unless otherwise directed. °3. If you need a refill on your pain medication, please contact your pharmacy.  They will contact our office to request authorization. Prescriptions will not be filled after 5pm or on week-ends. °4. You should follow a light diet the first few days after arrival home, such as soup and crackers, etc.  Be sure to include lots of fluids daily. °5. Most   patients will experience some swelling and bruising in the area of the incisions.  Ice packs will help.  Swelling and bruising can take several days to resolve.  °6. It is common to experience some constipation if taking pain medication after surgery.  Increasing fluid intake and taking a stool softener (such as  Colace) will usually help or prevent this problem from occurring.  A mild laxative (Milk of Magnesia or Miralax) should be taken according to package instructions if there are no bowel movements after 48 hours. °7. Unless discharge instructions indicate otherwise, you may remove your bandages 24-48 hours after surgery, and you may shower at that time.  You may have steri-strips (small skin tapes) in place directly over the incision.  These strips should be left on the skin for 7-10 days.  If your surgeon used skin glue on the incision, you may shower in 24 hours.  The glue will flake off over the next 2-3 weeks.  Any sutures or staples will be removed at the office during your follow-up visit. °8. ACTIVITIES:  You may resume regular (light) daily activities beginning the next day--such as daily self-care, walking, climbing stairs--gradually increasing activities as tolerated.  You may have sexual intercourse when it is comfortable.  Refrain from any heavy lifting or straining until approved by your doctor. °a. You may drive when you are no longer taking prescription pain medication, you can comfortably wear a seatbelt, and you can safely maneuver your car and apply brakes. °b. RETURN TO WORK:  __________________________________________________________ °9. You should see your doctor in the office for a follow-up appointment approximately 2-3 weeks after your surgery.  Make sure that you call for this appointment within a day or two after you arrive home to insure a convenient appointment time. °10. OTHER INSTRUCTIONS: __________________________________________________________________________________________________________________________ __________________________________________________________________________________________________________________________ °WHEN TO CALL YOUR DOCTOR: °1. Fever over 101.0 °2. Inability to urinate °3. Continued bleeding from incision. °4. Increased pain, redness, or drainage from the  incision. °5. Increasing abdominal pain ° °The clinic staff is available to answer your questions during regular business hours.  Please don’t hesitate to call and ask to speak to one of the nurses for clinical concerns.  If you have a medical emergency, go to the nearest emergency room or call 911.  A surgeon from [facility] is always on call at the hospital. °[address], [address], [place], [place]  [address] ? [address] [address], [place], [place]   [address] °[phone] ? [phone] ? FAX [phone] °Web site: [email or web address] ° °

## 2016-06-03 NOTE — Progress Notes (Signed)
Pt to d/c home. AVS reviewed and "My Chart" discussed with pt. Pt capable of verbalizing medications, signs and symptoms of infection, and follow-up appointments. Remains hemodynamically stable. No signs and symptoms of distress. Educated pt to return to ER in the case of SOB, dizziness, or chest pain.  

## 2016-06-03 NOTE — Progress Notes (Signed)
2 Days Post-Op  Subjective: He looks and feels much better today. Tolerating diet, positive flatus. Not complaining of any significant discomfort. Sites all look good.  Objective: Vital signs in last 24 hours: Temp:  [98 F (36.7 C)-98.6 F (37 C)] 98 F (36.7 C) (08/09 0654) Pulse Rate:  [61-74] 61 (08/09 0654) Resp:  [16-18] 16 (08/08 2230) BP: (98-117)/(44-62) 102/61 (08/09 0654) SpO2:  [99 %-100 %] 100 % (08/09 0654) Last BM Date: 05/30/16 960 PO Voided x 7 NO BM Afebrile, VSS WBC is down to 14.2 H/H stable BMP okay  Intake/Output from previous day: 08/08 0701 - 08/09 0700 In: 1910 [P.O.:960; I.V.:900; IV Piggyback:50] Out: 0  Intake/Output this shift: No intake/output data recorded.  General appearance: alert, cooperative and no distress Resp: clear to auscultation bilaterally GI: Sites look good, positive bowel sounds. Having flatus no BM so far  Lab Results:   Recent Labs  06/02/16 0510 06/03/16 0429  WBC 19.2* 14.2*  HGB 14.9 12.9*  HCT 41.3 37.3*  PLT 168 173    BMET  Recent Labs  06/02/16 0510 06/03/16 0429  NA 135 138  K 4.8 4.0  CL 102 105  CO2 25 28  GLUCOSE 161* 110*  BUN 14 16  CREATININE 1.01 1.11  CALCIUM 9.2 8.9   PT/INR No results for input(s): LABPROT, INR in the last 72 hours.   Recent Labs Lab 06/01/16 1600  AST 24  ALT 25  ALKPHOS 66  BILITOT 1.5*  PROT 7.5  ALBUMIN 4.2     Lipase     Component Value Date/Time   LIPASE 24 06/01/2016 1600     Studies/Results: Ct Abdomen Pelvis W Contrast  Result Date: 06/01/2016 CLINICAL DATA:  RIGHT lower quadrant pain and tenderness EXAM: CT ABDOMEN AND PELVIS WITH CONTRAST TECHNIQUE: Multidetector CT imaging of the abdomen and pelvis was performed using the standard protocol following bolus administration of intravenous contrast. Sagittal and coronal MPR images reconstructed from axial data set. CONTRAST:  100mL ISOVUE-300 IOPAMIDOL (ISOVUE-300) INJECTION 61% IV. Dilute oral  contrast. COMPARISON:  None FINDINGS: Lower chest:  Lung bases clear Hepatobiliary: Tiny cyst posterior RIGHT lobe liver image 17. Minimal fatty infiltration of liver adjacent to falciform fissure. Gallbladder and liver otherwise normal appearance. Pancreas: Normal appearance Spleen: Normal appearance Adrenals/Urinary Tract: Adrenal glands normal appearance. Kidneys, ureters and bladder normal appearance. Stomach/Bowel: Enlarged thickened appendix with significant periappendiceal inflammatory changes compatible with acute appendicitis. Adjacent thickening of the cecal tip and an adjacent small bowel loop. Small amount of free pelvic fluid without definite extraluminal gas collection to suggest abscess. No free intraperitoneal air. Stomach and remaining bowel loops unremarkable. Vascular/Lymphatic: Vascular structures patent. Few reactive mesenteric nodes without definite adenopathy. Reproductive: N/A Other: No hernia. Musculoskeletal: Osseous structures unremarkable. IMPRESSION: Acute appendicitis with significant periappendiceal and RIGHT lower quadrant inflammatory changes including thickening of a small bowel loop and of the cecal tip though definite evidence of perforation or abscess is identified. Findings called to Dr. Ethelda ChickJacubowitz on 06/01/2016 at 1658 hours. Electronically Signed   By: Ulyses SouthwardMark  Boles M.D.   On: 06/01/2016 16:59    Medications: . enoxaparin (LOVENOX) injection  40 mg Subcutaneous Q24H  . ketorolac  30 mg Intravenous Q6H  . piperacillin-tazobactam (ZOSYN)  IV  3.375 g Intravenous Q8H    Assessment/Plan Acute appendicitis with perforation and peritoneal abscess                       S/p laparoscopic appendectomy, 06/01/16,  Dr. Manus Rudd POD 2 FEN: clears/IV fluids  - , soft diet this a.m. ID:   Zosyn day 2 completed starting day 3 DVT:  Lovenox/SCD   Plan: Mobilize, soft diet.  If he does, will discharge home later today, on 7 more days of antibiotics. At a probiotic.    LOS: 1  day    Brandon Berry,Brandon Berry 06/03/2016 [phone]  Agree with above.  Ovidio Kin, MD, [facility] Surgery Pager: [phone] Office phone:  [phone]

## 2016-06-16 NOTE — Discharge Summary (Signed)
Physician Discharge Summary  Patient ID: Brandon Berry MRN: [id number] DOB/AGE: 1991/10/06 25 y.o.  Admit date: 06/01/2016 Discharge date: 06/03/2016  Admission Diagnoses:  Acute appendicitis with peritoneal abscess  Discharge Diagnoses:   Active Problems:   Acute appendicitis   PROCEDURES: Laparoscopic appendectomy 06/01/16, Dr. Orpah Clintonsuei  Hospital Course:  25 yo male presents with a 7 day history of lower abdominal pain that has localized to his RLQ.  His pain has worsened with ambulation and movement. He has been constipated for the last 3 days and has some dark urine and dysuria.  No nausea or vomiting.  [facility] and was found to have acute appendicitis. He was admitted by Dr. Corliss Skainssuei and taken to the OR at that time.  He did well with surgery and post op.  He has peritonitis and was kept an additional 24 hours for IV antibiotics.  He looked great and was sent home on 7 more days of oral antibiotics.    Condition on d/c:  Improved  CBC Latest Ref Rng & Units 06/03/2016 06/02/2016 06/01/2016  WBC 4.0 - 10.5 K/uL 14.2(H) 19.2(H) 14.1(H)  Hemoglobin 13.0 - 17.0 g/dL 12.9(L) 14.9 14.8  Hematocrit 39.0 - 52.0 % 37.3(L) 41.3 42.3  Platelets 150 - 400 K/uL 173 168 176   CMP Latest Ref Rng & Units 06/03/2016 06/02/2016 06/01/2016  Glucose 65 - 99 mg/dL 409(W110(H) 119(J161(H) -  BUN 6 - 20 mg/dL 16 14 -  Creatinine 4.780.61 - 1.24 mg/dL 2.951.11 6.211.01 3.081.03  Sodium 135 - 145 mmol/L 138 135 -  Potassium 3.5 - 5.1 mmol/L 4.0 4.8 -  Chloride 101 - 111 mmol/L 105 102 -  CO2 22 - 32 mmol/L 28 25 -  Calcium 8.9 - 10.3 mg/dL 8.9 9.2 -  Total Protein 6.5 - 8.1 g/dL - - -  Total Bilirubin 0.3 - 1.2 mg/dL - - -  Alkaline Phos 38 - 126 U/L - - -  AST 15 - 41 U/L - - -  ALT 17 - 63 U/L - - -    Disposition: 01-Home or Self Care     Medication List    STOP taking these medications   dicyclomine 10 MG capsule Commonly known as:  BENTYL   phenazopyridine 200 MG tablet Commonly known as:  PYRIDIUM      TAKE these medications   acetaminophen 325 MG tablet Commonly known as:  TYLENOL You can take Tylenol(acetaminophen) or ibuprofen measure first line treatment. Do not take more than 4000 mg of Tylenol per day.   amoxicillin-clavulanate 875-125 MG tablet Commonly known as:  AUGMENTIN Take 1 tablet by mouth every 12 (twelve) hours.   ibuprofen 200 MG tablet Commonly known as:  ADVIL,MOTRIN You can take 2-3 tablets every 6 hours as needed for pain. I would use this is her first line treatment. He is narcotic as second, backup treatment. What changed:  medication strength  how much to take  how to take this  when to take this  reasons to take this  additional instructions   oxyCODONE 5 MG immediate release tablet Commonly known as:  Oxy IR/ROXICODONE Take 1-2 tablets (5-10 mg total) by mouth every 4 (four) hours as needed for moderate pain.   saccharomyces boulardii 250 MG capsule Commonly known as:  FLORASTOR You can by this at any drugstore over-the-counter. Take it for at least one week after you complete the antibiotics and your bowel movements are normal.      Follow-up Information  [facility] Follow up on 06/17/2016.   Specialty:  General Surgery Why:  Your appointment is at 10:45 AM, be at the office 30 minutes early for check-in. Contact information: [address] [place] [address] [phone]           Signed: Sherrie GeorgeJENNINGS,WILLARD 06/16/2016, 3:37 PM  Agree with above.  Ovidio Kinavid Kilynn Fitzsimmons, MD, [facility] Pager: [phone] Office phone:  [phone]

## 2017-02-20 IMAGING — CT CT ABD-PELV W/ CM
2 of 4 series · 16 of 46 positions shown, 18 images · IV contrast (APPLIED)
Comparison: None

CLINICAL DATA: RIGHT lower quadrant pain and tenderness

EXAM:
CT ABDOMEN AND PELVIS WITH CONTRAST
TECHNIQUE: Multidetector CT imaging of the abdomen and pelvis was performed
using the standard protocol following bolus administration of
intravenous contrast. Sagittal and coronal MPR images reconstructed
from axial data set.
CONTRAST:  100mL [id number] IOPAMIDOL ([id number]) INJECTION 61% IV.
Dilute oral contrast.

[Series 2: axial st · axial · 0.77mm/px · z∈[-652,-216]mm · 13 of 95 slices shown, 15 images]
[im 4/95  soft-tissue]
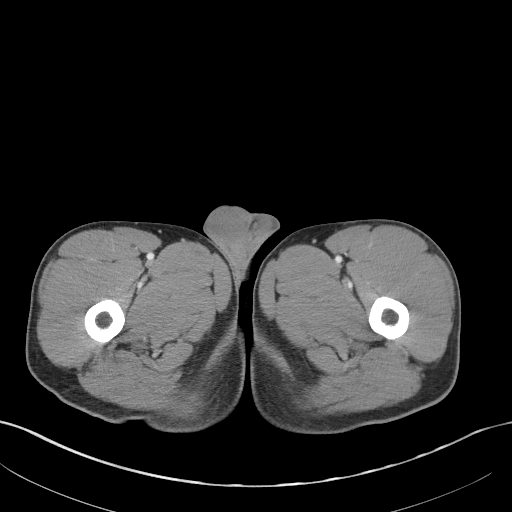
[im 4/95  bone]
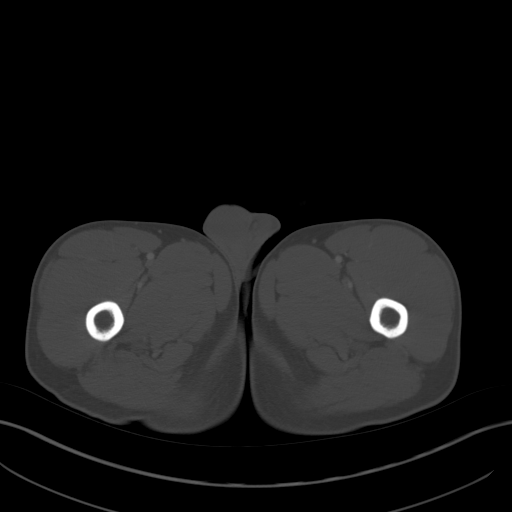
[im 12/95  soft-tissue]
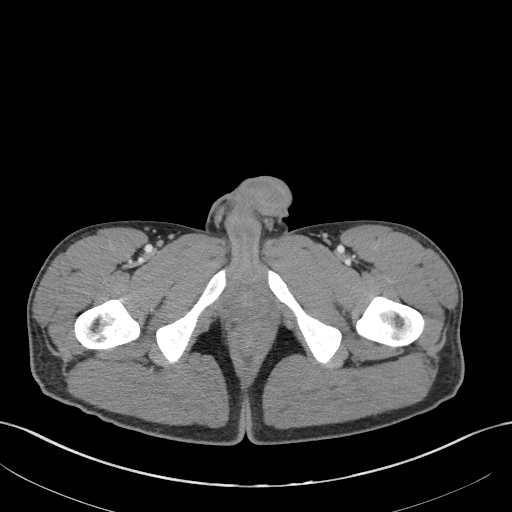
[im 19/95  soft-tissue]
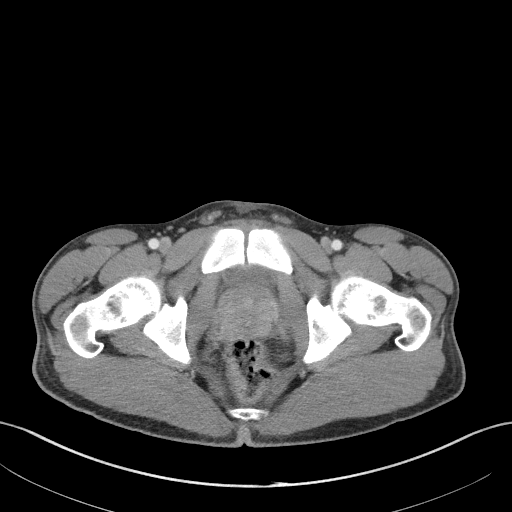
[im 27/95  soft-tissue]
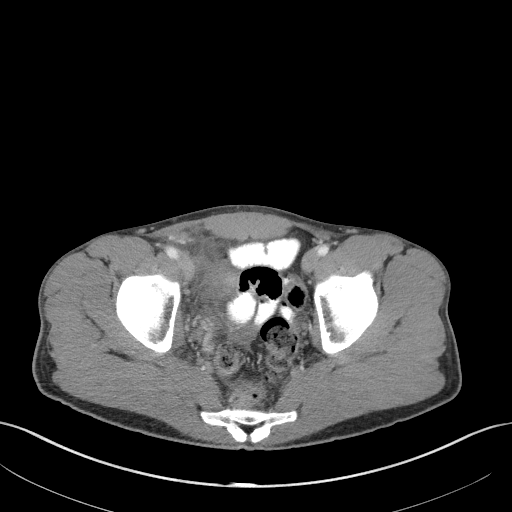
[im 34/95  soft-tissue]
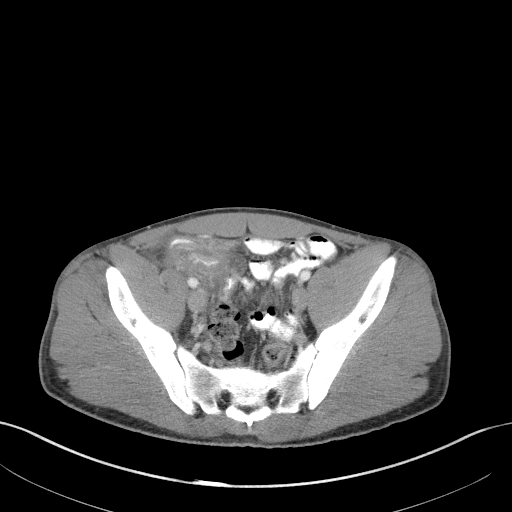
[im 42/95  soft-tissue]
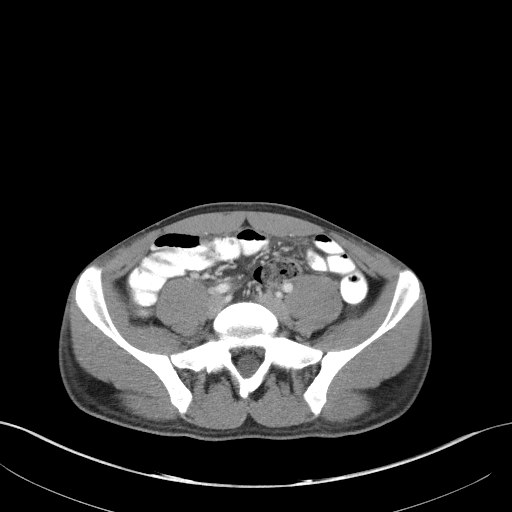
[im 49/95  soft-tissue]
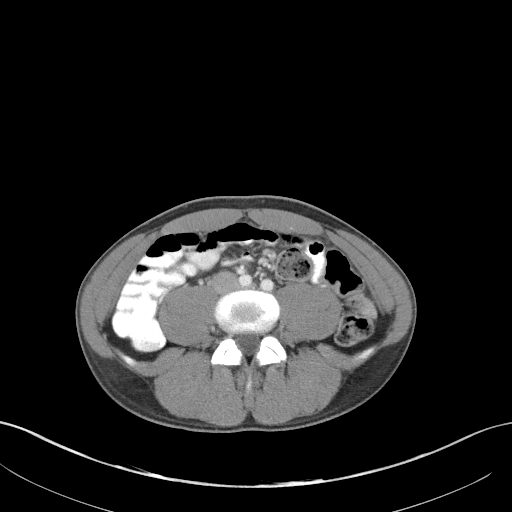
[im 53/95  soft-tissue]
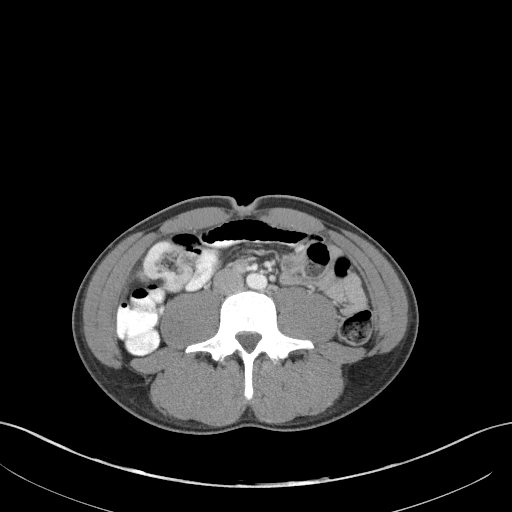
[im 61/95  soft-tissue]
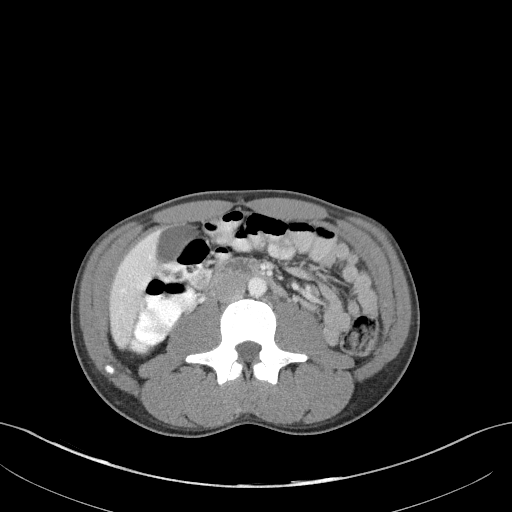
[im 61/95  bone]
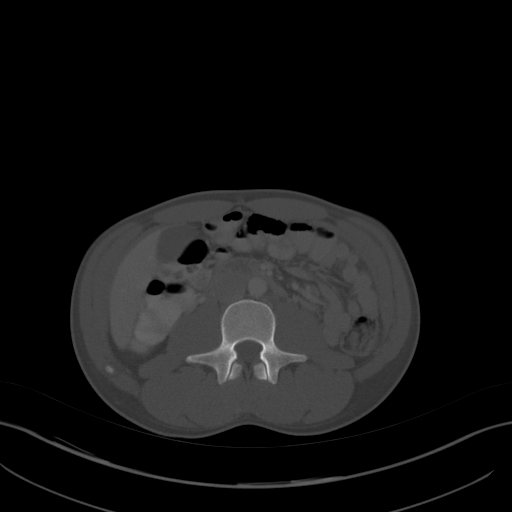
[im 68/95  soft-tissue]
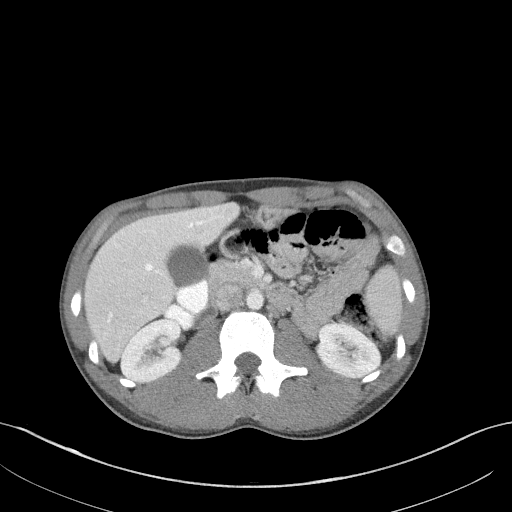
[im 76/95  soft-tissue]
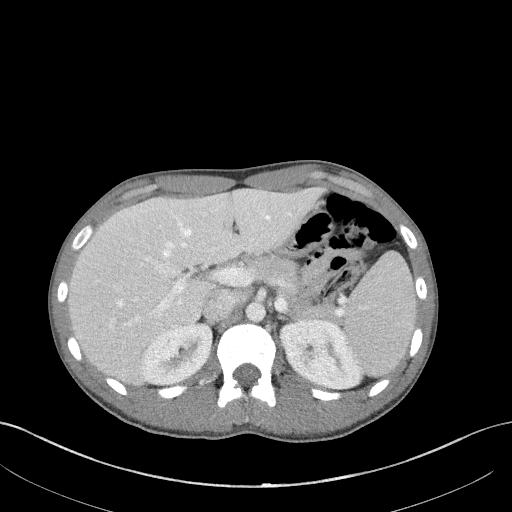
[im 83/95  soft-tissue]
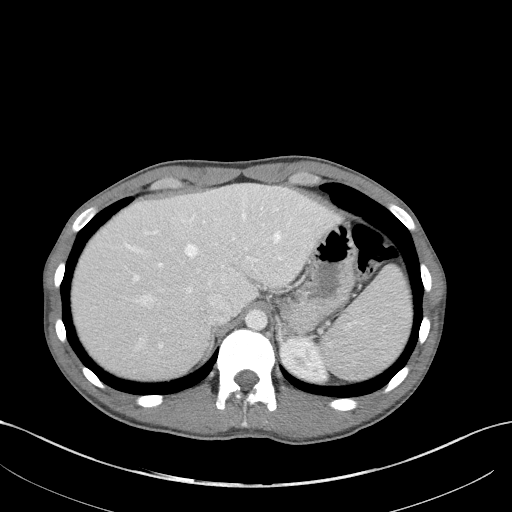
[im 91/95  soft-tissue]
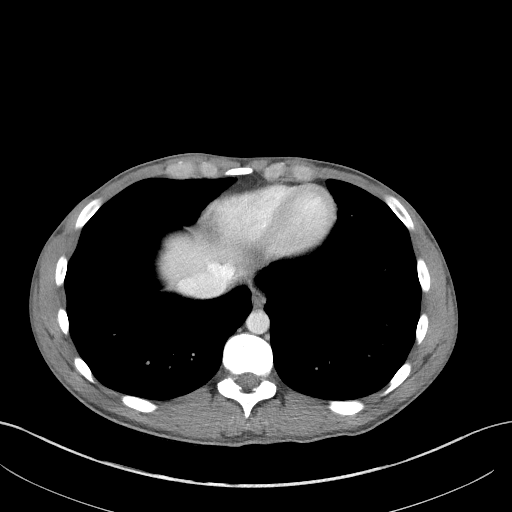

[Series 5: coronal st · coronal · 0.78mm/px · 3 of 90 slices shown]
[im 30/90  soft-tissue]
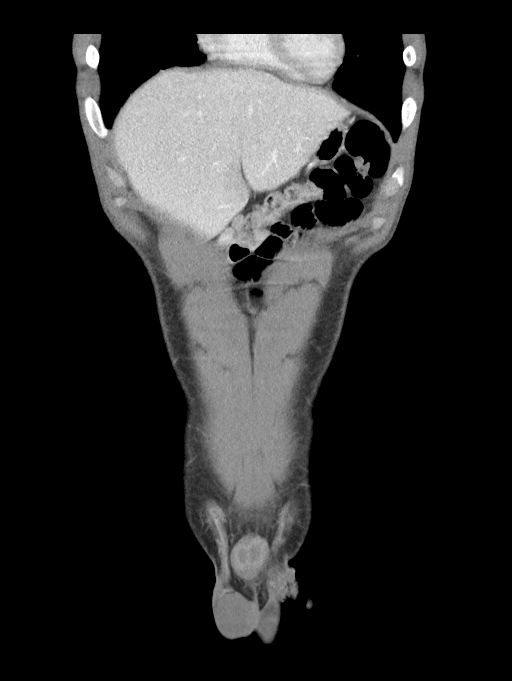
[im 40/90  soft-tissue]
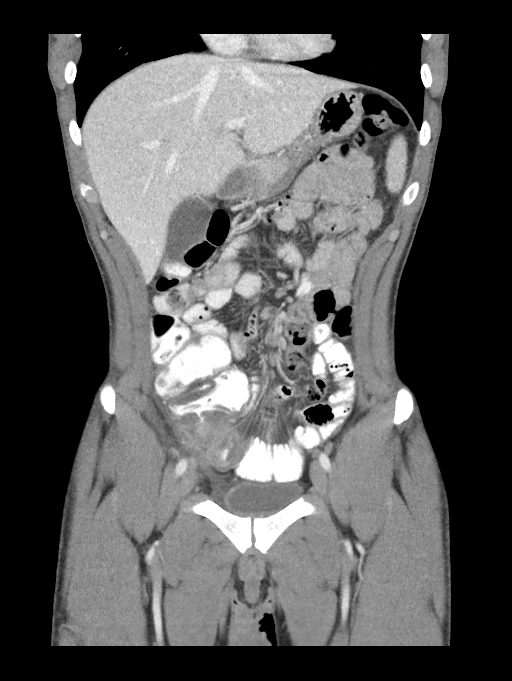
[im 50/90  soft-tissue]
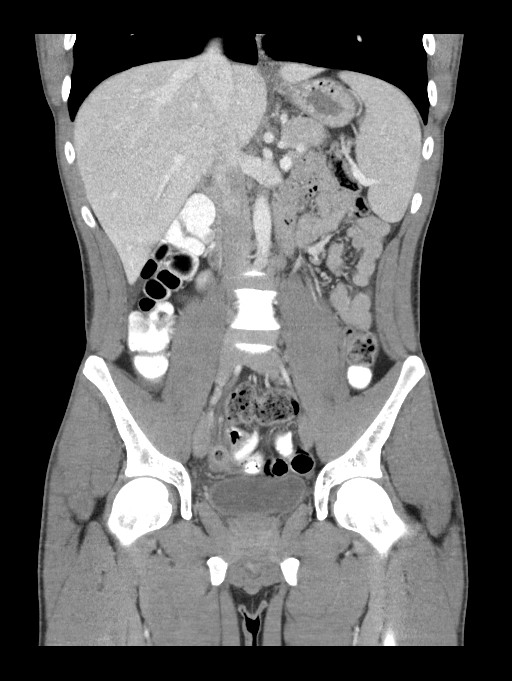

[16 of 46 positions shown; findings below may reference images not displayed]

FINDINGS: Lower chest:  Lung bases clear

Hepatobiliary: Tiny cyst posterior RIGHT lobe liver image 17.
Minimal fatty infiltration of liver adjacent to falciform fissure.
Gallbladder and liver otherwise normal appearance.

Pancreas: Normal appearance

Spleen: Normal appearance

Adrenals/Urinary Tract: Adrenal glands normal appearance. Kidneys,
ureters and bladder normal appearance.

Stomach/Bowel: Enlarged thickened appendix with significant
periappendiceal inflammatory changes compatible with acute
appendicitis. Adjacent thickening of the cecal tip and an adjacent
small bowel loop. Small amount of free pelvic fluid without definite
extraluminal gas collection to suggest abscess. No free
intraperitoneal air. Stomach and remaining bowel loops unremarkable.

Vascular/Lymphatic: Vascular structures patent. Few reactive
mesenteric nodes without definite adenopathy.

Reproductive: N/A

Other: No hernia.

Musculoskeletal: Osseous structures unremarkable.
IMPRESSION: Acute appendicitis with significant periappendiceal and RIGHT lower
quadrant inflammatory changes including thickening of a small bowel
loop and of the cecal tip though definite evidence of perforation or
abscess is identified.

Findings called to Dr. Jiletu on 06/01/2016 at 9652 hours.
